# Patient Record
Sex: Female | Born: 2000 | Race: Black or African American | Marital: Single | Smoking: Never smoker
Health system: Northeastern US, Community
[De-identification: ages and names within clinical notes are randomized; demographics above are authoritative.]

---

## 2020-11-06 ENCOUNTER — Encounter (HOSPITAL_BASED_OUTPATIENT_CLINIC_OR_DEPARTMENT_OTHER): Payer: Self-pay | Admitting: Registered Nurse

## 2020-11-06 ENCOUNTER — Ambulatory Visit: Payer: No Typology Code available for payment source | Attending: Internal Medicine | Admitting: Registered Nurse

## 2020-11-06 DIAGNOSIS — Z113 Encounter for screening for infections with a predominantly sexual mode of transmission: Secondary | ICD-10-CM | POA: Diagnosis present

## 2020-11-06 DIAGNOSIS — Z0000 Encounter for general adult medical examination without abnormal findings: Secondary | ICD-10-CM | POA: Insufficient documentation

## 2020-11-06 NOTE — Progress Notes (Signed)
Judy Bell is a 19 year old female English speaking patient presents for a video visit in order to establish care with clinic, here from [place] short term since 9/21. No reported PMH or family history. Feeling well.     Updated meds, allergies, PMH, family history, social history, etc.    LMP:  10/20/20, regular, monthly periods, lasting 3-7 days of moderate bleeding, not on birth control currently, sexually active with female partners  Pap smear: never, will start age 19 for screening     Diet:meat and fish vegetables, fruits, whole grains, sweets, fast food, carbs, eats out mostly, no coffee intake reported   Exercise: no formal exercise     Height and weight:  5'2  62kg    covid- vaccinated in [place] [employer]   05/25/20, 07/23/20    Never smoker, no alcohol intake reported, no other drug use reported.     REVIEW OF SYSTEMS:  Constitutional: Patient denies any unexpected weight change, chronic fatigue, fevers or chills.  Respiratory: Patient denies any shortness of breath, wheezing, or new unexplained cough.  Cardiovascular: Patient denies any chest pains, chest palpitations, or chest tightness.  GI: Patient denies abdominal pain and has a good appetite, no nausea/vomiting, and normal bowel function without bloody stool.  GU: Denies vaginal discharge, pain, lesions, nocturia, dysuria, frequency, hesitancy, hematuria and retention.  MS: Patient denies any flank pain, back pain, myalgias, or recent skeletal fractures.  Neurologic: Patient denies any new recent headaches, weakness, dizziness or change in vision.   Psychiatry: Patient denies any new confusion, depression or anxiety.  Heme: no unexplained bruising or bleeding.  Skin: Patient denies and new rash, wounds, or other skin lesions.      Allergies:  Review of Patient's Allergies indicates:  No Known Allergies     No current outpatient medications on file prior to visit.  No current facility-administered medications on file prior to  visit.    Objective:  Physical exam:  Patient is calm and in no apparent distress. Able to speak in full sentences.     COGNITIVE STATUS AND LANGUAGE:  Alert   Oriented to person, place, time, and situation  Language: speech is fluent with normal comprehension; no dysarthria  Recent and remote memory are intact with appropriate fund of knowledge  Attention and concentration are appropriate    Assessment and plan:    (Z00.00) Physical exam  (primary encounter diagnosis)  Comment: 19 year old, feeling well, screening labs ordered   Plan: CBC WITH PLATELET, BASIC METABOLIC PANEL, LIPID        PANEL, HEMOGLOBIN A1C, THYROID SCREEN TSH         REFLEX FT4, VITAMIN D,25 HYDROXY        Will get in person, needs pap smear, will get vaccine record to bring to appointment     (Z11.3) Screening examination for STD (sexually transmitted disease)  Comment: asymptomatic, screening labs ordered   Plan: HEPATITIS C ANTIBODY, HIV ANTIGEN ANTIBODY 5TH         GEN, RPR, CHLAMYDIA GC NAAT        Will f/u with results     *I have discussed the frequency, duration, and side-effects of the prescribed medications.  The patient expresses knowledge and understanding of medications including possible side-effects and barriers. No barriers to adherence were identified.      *Medications and allergies are reviewed at this visit.  If medication changes were made during this visit, the patient was given a new medication sheet.  Dayton Martes, APRN

## 2020-12-02 ENCOUNTER — Ambulatory Visit (HOSPITAL_BASED_OUTPATIENT_CLINIC_OR_DEPARTMENT_OTHER): Payer: Self-pay | Admitting: Registered Nurse

## 2020-12-03 ENCOUNTER — Ambulatory Visit: Payer: No Typology Code available for payment source | Attending: Internal Medicine | Admitting: Internal Medicine

## 2020-12-03 ENCOUNTER — Encounter (HOSPITAL_BASED_OUTPATIENT_CLINIC_OR_DEPARTMENT_OTHER): Payer: Self-pay | Admitting: Internal Medicine

## 2020-12-03 ENCOUNTER — Other Ambulatory Visit: Payer: Self-pay

## 2020-12-03 VITALS — BP 117/69 | HR 84 | Temp 98.8°F | Ht 63.1 in | Wt 158.0 lb

## 2020-12-03 DIAGNOSIS — Z23 Encounter for immunization: Secondary | ICD-10-CM | POA: Diagnosis present

## 2020-12-03 DIAGNOSIS — N898 Other specified noninflammatory disorders of vagina: Secondary | ICD-10-CM | POA: Insufficient documentation

## 2020-12-03 DIAGNOSIS — Z0000 Encounter for general adult medical examination without abnormal findings: Secondary | ICD-10-CM

## 2020-12-03 DIAGNOSIS — Z124 Encounter for screening for malignant neoplasm of cervix: Secondary | ICD-10-CM | POA: Diagnosis present

## 2020-12-03 DIAGNOSIS — Z308 Encounter for other contraceptive management: Secondary | ICD-10-CM | POA: Diagnosis present

## 2020-12-03 DIAGNOSIS — Z113 Encounter for screening for infections with a predominantly sexual mode of transmission: Secondary | ICD-10-CM

## 2020-12-03 LAB — VAGINITIS PANEL PCR
BACTERIAL VAGINOSIS: NEGATIVE
CANDIDA GLABRATA: NEGATIVE
CANDIDA KRUSEI: NEGATIVE
CANDIDA SPECIES: NEGATIVE
TRICH VAGINALIS: NEGATIVE

## 2020-12-03 LAB — CBC WITH PLATELET
ABSOLUTE NRBC COUNT: 0 10*3/uL (ref 0.0–0.0)
HEMATOCRIT: 40.5 % (ref 34.1–44.9)
HEMOGLOBIN: 13.1 g/dL (ref 11.2–15.7)
MEAN CORP HGB CONC: 32.3 g/dL (ref 31.0–37.0)
MEAN CORPUSCULAR HGB: 28.9 pg (ref 26.0–34.0)
MEAN CORPUSCULAR VOL: 89.4 fl (ref 80.0–100.0)
MEAN PLATELET VOLUME: 12.6 fL — ABNORMAL HIGH (ref 8.7–12.5)
NRBC %: 0 % (ref 0.0–0.0)
PLATELET COUNT: 258 10*3/uL (ref 150–400)
RBC DISTRIBUTION WIDTH STD DEV: 45 fL (ref 35.1–46.3)
RED BLOOD CELL COUNT: 4.53 M/uL (ref 3.90–5.20)
WHITE BLOOD CELL COUNT: 10.9 10*3/uL (ref 4.0–11.0)

## 2020-12-03 LAB — HIV ANTIGEN ANTIBODY 5TH GEN: HIVAGAB QUALITATIVE: NONREACTIVE

## 2020-12-03 LAB — RPR: RPR QUALITATIVE: NONREACTIVE

## 2020-12-03 NOTE — Progress Notes (Signed)
Influenza and Pneumococcal Vaccine Procedure  December 03, 2020    1. Has the patient received the information for the influenza and pneumococcal vaccine? Yes    2. Does the patient have any of the following contraindications?  Allergy to eggs? No  Allergic reaction to previous influenza vaccines? No  Any other problems to previous influenza vaccines? No  Paralyzed by Guillain-Barre syndrome?  No  Current moderate or severe illness? No  Allergy to contact lens solution? No    3. The vaccines have been administered in the usual fashion and the patient/guardian was instructed to wait 20 minutes before leaving the building in the event of an allergic reaction:     Immunization information reviewed. Current VIS reviewed and given to patient/ guardian. Verbal assent obtained from patient/ guardian. Comfort measures for possible side effects reviewed.

## 2020-12-03 NOTE — Progress Notes (Addendum)
Adaja Wander is a 19 year old female, English speaking    Here for Pap Smear   She also wants to start on birthcontrol and get tested for STD/STI  Denies any STI/STD symptom    ROS:  CV: denies  Resp: denies any palpitations or chest pain  Neuro: denies any weakness or dizziness  Psych: feeling safe at home    Review of Patient's Allergies indicates:  No Known Allergies     Objectives  BP 117/69 (Site: RA, Position: Sitting, Cuff Size: Reg)    Pulse 84    Temp 98.8 F (37.1 C) (Oral)    Ht 5' 3.1" (1.603 m)    Wt 71.7 kg (158 lb)    SpO2 100%    BMI 27.90 kg/m      Constitutional: Well developed, Well nourished, No acute distress,   CV:NL S1/S2, No murmurs, rubs or gallops.   Resp:CTAB, No respiratory distress, No wheezing, crackles, rhonchi   Neurologic: Alert and oriented x4, moves all extremities with full power and strength, sensation intact bilaterally, cranial nerves intact, speaking in clear fluent sentences. Normal gait   Skin: Clear with no lesions  Psych: normal mood / behavior, pleasant    PELVIC:  External Genitalia:normal architecture  Urethral Meatus:normal size and location, without lesions or prolapse  Urethra:without masses, tenderness or scarring  Bladder:without fullness, masses or tenderness  Vagina:Intact with no lesions  Vaginal Discharge:moderate thick white discharge  Pelvic supports:normal  Cervix:no cervical motion tenderness  Uterus:non-tender    Plan  (N89.8) Vaginal discharge  (primary encounter diagnosis)  Comment  Plan: VAGINITIS PANEL PCR  Provider will cal or mail the results to pt    (Z12.4) Cervical cancer screening  Comment  Plan: CYTOPATH, C/V, THIN LAYER  Provider will call or mail results to pt    (Z30.8) Encounter for other contraceptive management  Comment  Plan: REFERRAL TO GYNECOLOGY ([facility])  To follow up with her PCP    (Z23) Need for prophylactic vaccination and inoculation against influenza  Comment  Plan: IMMUNIZATION ADMIN SINGLE, IIV4 VACC  PRESERV   FREE AGE 67 MONTHS AND OLDER, 0.5ML, IM          (Z11.3) Screening examination for STD (sexually transmitted disease)  Comment  Plan: RPR, HIV ANTIGEN ANTIBODY 5TH GEN, HEPATITIS C   ANTIBODY  Order placed by PCP. Pt will F/U with PCP for results    (Z00.00) Physical exam  Comment  Plan: VITAMIN D,25 HYDROXY, THYROID SCREEN TSH REFLEX   FT4, HEMOGLOBIN A1C, LIPID PANEL, BASIC    METABOLIC PANEL, CBC WITH PLATELET   Order placed by PCP. Pt will  F/U with PCP for results     Mat Carne, RN, 12/03/2020

## 2020-12-03 NOTE — Progress Notes (Signed)
Judy Bell is a 19 year old female, English speaking  Patient of Carlena Sax APRN    Pt is here for Pap smear  Requesting for birth control  And std testing   Denies any vaginal discharge or itchiness   Denies any urinary symptoms    ROS:  CV: denies any chest pain  RESP: denies any shortness of breath  Neuro: denies any weakness or dizziness  Psych: safe at home    Allergies:  Review of Patient's Allergies indicates:  No Known Allergies    Objective  BP 117/69 (Site: RA, Position: Sitting, Cuff Size: Reg)    Pulse 84    Temp 98.8 F (37.1 C) (Oral)    Ht 5' 3.1" (1.603 m)    Wt 71.7 kg (158 lb)    SpO2 100%    BMI 27.90 kg/m     Constitutional: Well developed, Well nourished, No acute distress,   CV:NL S1/S2, No murmurs, rubs or gallops.   Resp:CTAB, No respiratory distress, No wheezing, crackles, rhonchi   Neurologic: Alert and oriented x4, moves all extremities with full power and strength, sensation intact bilaterally, cranial nerves intact, speaking in clear fluent sentences. Normal gait   Psych: normal mood / behavior, pleasant    PELVIC:  External Genitalia: normal architecture  Urethral Meatus: normal size and location, without lesions or prolapse  Urethra: without masses, tenderness or scarring  Bladder: without fullness, masses or tenderness  Vagina: well rugated and no lesions  Vaginal Discharge: moderate thick white discharge  Pelvic supports: normal  Cervix: no cervical motion tenderness  Uterus: non-tender  Adnexa: no masses, nodularity, tenderness    PLAN  (N89.8) Vaginal discharge  (primary encounter diagnosis)  Comment:   Plan: VAGINITIS PANEL PCR        Will call or mail results    (Z12.4) Cervical cancer screening  Comment:   Plan: CYTOPATH, C/V, THIN LAYER        Will call or mail results    (Z30.8) Encounter for other contraceptive management  Comment:   Plan: REFERRAL TO GYNECOLOGY ([facility])        To follow up with GYN    (Z23) Need for prophylactic vaccination and inoculation  against influenza  Comment:   Plan: IMMUNIZATION ADMIN SINGLE, IIV4 VACC PRESERV         FREE AGE 74 MONTHS AND OLDER, 0.5ML, IM    (Z11.3) Screening examination for STD (sexually transmitted disease)  Comment:   Plan: RPR, HIV ANTIGEN ANTIBODY 5TH GEN, HEPATITIS C         ANTIBODY        Ordered by PCP         To follow up with PCP for results    (Z00.00) Physical exam  Comment:   Plan: VITAMIN D,25 HYDROXY, THYROID SCREEN TSH REFLEX        FT4, HEMOGLOBIN A1C, LIPID PANEL, BASIC         METABOLIC PANEL, CBC WITH PLATELET        Ordered by PCP        To follow up with PCP for results     I have discussed the frequency, duration, and side-effects of  the prescribed medications.  The patient expresses knowledge and understanding of medications including possible side-effects and barriers. No barriers to adherence were identified.     Medications and allergies are reviewed at this visit.  If medication changes were made during this visit, the patient was given a new  medication sheet.    Judy Mcloughlin Yaakov Guthrie, APRN, 12/03/2020

## 2020-12-04 LAB — HEMOGLOBIN A1C
ESTIMATED AVERAGE GLUCOSE: 111 mg/dL (ref 74–160)
HEMOGLOBIN A1C: 5.5 % (ref 4.0–5.6)

## 2020-12-07 ENCOUNTER — Encounter (HOSPITAL_BASED_OUTPATIENT_CLINIC_OR_DEPARTMENT_OTHER): Payer: Self-pay | Admitting: Internal Medicine

## 2020-12-07 LAB — THYROID SCREEN TSH REFLEX FT4: THYROID SCREEN TSH REFLEX FT4: 1.44 u[IU]/mL (ref 0.358–3.740)

## 2020-12-07 LAB — LIPID PANEL
Cholesterol: 141 mg/dL (ref 0–239)
HIGH DENSITY LIPOPROTEIN: 72 mg/dL (ref 40–?)
LOW DENSITY LIPOPROTEIN DIRECT: 48 mg/dL (ref 0–189)
TRIGLYCERIDES: 93 mg/dL (ref 0–150)

## 2020-12-07 LAB — BASIC METABOLIC PANEL
ANION GAP: 5 mmol/L (ref 5–15)
BUN (UREA NITROGEN): 12 mg/dL (ref 7–18)
CALCIUM: 9.2 mg/dL (ref 8.5–10.1)
CARBON DIOXIDE: 27 mmol/L (ref 21–32)
CHLORIDE: 106 mmol/L (ref 98–107)
CREATININE: 0.8 mg/dL (ref 0.4–1.2)
ESTIMATED GLOMERULAR FILT RATE: >60 mL/min (ref >60)
Glucose Random: 65 mg/dL — ABNORMAL LOW (ref 74–160)
POTASSIUM: 3.9 mmol/L (ref 3.5–5.1)
SODIUM: 138 mmol/L (ref 136–145)

## 2020-12-07 LAB — VITAMIN D,25 HYDROXY: VITAMIN D,25 HYDROXY: 28 ng/mL — ABNORMAL LOW (ref 30.0–100.0)

## 2020-12-07 LAB — CYTOPATH, C/V, THIN LAYER

## 2020-12-07 LAB — HEPATITIS C ANTIBODY: HEPATITIS C ANTIBODY: NONREACTIVE

## 2020-12-08 ENCOUNTER — Encounter (HOSPITAL_BASED_OUTPATIENT_CLINIC_OR_DEPARTMENT_OTHER): Payer: Self-pay | Admitting: Registered Nurse

## 2020-12-23 ENCOUNTER — Telehealth (HOSPITAL_BASED_OUTPATIENT_CLINIC_OR_DEPARTMENT_OTHER): Payer: Self-pay

## 2020-12-23 ENCOUNTER — Ambulatory Visit (HOSPITAL_BASED_OUTPATIENT_CLINIC_OR_DEPARTMENT_OTHER): Payer: No Typology Code available for payment source

## 2020-12-23 NOTE — Telephone Encounter (Signed)
Lmom for patient to call back. Please schedule an appointment with the family planner. Per referral patient not sure if she wants IUD or Nexplanon. Attach referral

## 2020-12-24 ENCOUNTER — Telehealth (HOSPITAL_BASED_OUTPATIENT_CLINIC_OR_DEPARTMENT_OTHER): Payer: Self-pay | Admitting: Registered Nurse

## 2020-12-24 NOTE — Telephone Encounter (Signed)
Second attempt:    Hilario Quarry made the 2nd attempt on 12/24/20 and lmom for patient to call back.Clarisa Kindred  to [employer] Desk Pool    MB     12/24/20 1:48 PM  Lmom for patient to call back. Please schedule an appointment with the family planner. Per referral patient not sure if she wants IUD or Nexplanon. Attach referral

## 2020-12-28 ENCOUNTER — Encounter (HOSPITAL_BASED_OUTPATIENT_CLINIC_OR_DEPARTMENT_OTHER): Payer: Self-pay

## 2020-12-28 DIAGNOSIS — Z30011 Encounter for initial prescription of contraceptive pills: Secondary | ICD-10-CM

## 2020-12-28 NOTE — Progress Notes (Signed)
3rd attempt, mychart message sent

## 2020-12-28 NOTE — Progress Notes (Signed)
2nd attempt, lmom asking for a call back

## 2020-12-29 ENCOUNTER — Ambulatory Visit: Payer: No Typology Code available for payment source | Attending: Internal Medicine

## 2020-12-29 ENCOUNTER — Other Ambulatory Visit: Payer: Self-pay

## 2020-12-29 DIAGNOSIS — Z3009 Encounter for other general counseling and advice on contraception: Secondary | ICD-10-CM

## 2020-12-29 NOTE — Progress Notes (Signed)
- This patient was identified as meeting criteria for telephone encounter rather than an in person visit due to public health concerns around COVID-19.  - Patient identity was verbally confirmed by the patient with two identifiers (name, date of birth, and/or address).  - Patient was located home during the visit and they were encouraged to be in a private location due to personal health information being discussed.  - Health counselor was located outside the office at a secure location during the visit.  - Pt was informed in how to access face-to-face care in the event of an emergency.      Reason for Visit: Contraceptive counseling    Confidential billing needed(ZZFamPlan): no  Safe or Confidential Phone #: [phone]  Preferred Name: Lenox Ahr  Pronouns: she/her/hers    Social:  Home Life: with cousin  Work: yes  School: no    Medications and allergies reviewed.    Social History    Tobacco Use      Smoking status: Never Smoker      Smokeless tobacco: Never Used      GYN:  LMP:12/06/20  OB History    No obstetric history on file.  Pregnancy Desired:no  Last GYN Exam (date/result): 12/03/20  Hx of Abnormal PAP: no  Current GYN Symptoms: no    Sexual History:    Sexual activity: Yes   Partners: Male     Current Partner(s): yes  Current Sexual Activity: vaginal  Satisfaction with Current Method: not using bcm  Missed Pills, Patch, Ring Change, or Depo:n/a  Side Effects/Problems: n/a  ACHES: n/a  Using Method Correctly: n/a  Contraceptive Use History: ocp  Contraindications Screening (as applicable):   Contraindications:  Has patient:  - Given birth in the past 6 weeks - no  - Ever been hospitalized, other than for childbirth - no  - Ever had surgery - no    Does patient report having or having being told that they have:   - Breast problems or breast lumps - no  - Bleeding between periods or unexplained vaginal bleeding - no  - High blood pressure - no  - Migraine headaches - no   History of migraine with aura:no  -  Heart problems - no  - Blood clot or stroke - no  - Diabetes - no  - Problems with liver (including hepatitis) - no  - Cancer - no  - Lupus - no    Date of Last Sexual Activity: 12/10/20  Protection Used: yes  EC Need:no  Condom Use:  sometimes  Condom Breakage: no  Condom Instruction Given: yes    Sexual Concerns or Questions: no    History of STIs:    Recent Testing: yes  Component      Latest Ref Rng & Units 12/03/2020   HIVAGAB QUALITATIVE      NONREACTIVE NON-REACTIVE   HIV-1 ANTIBODY 5TH GEN      0.00 - 0.99 INDEX TNP   HIV-2 ANTIBODY 5TH GEN      0.00 - 0.99 INDEX TNP   HIV-1 ANTIGEN 5TH GEN      0.00 - 0.99 INDEX TNP   HIV-1 ANTIBODY GEENIUS       TNP   HIV-2 ANTIBODY GEENIUS       TNP   HIV RESULT INTERPRETATION       TNP   RPR QUALITATIVE      NONREACTIVE NON-REACTIVE   HEPATITIS C ANTIBODY      NONREACTIVE NON-REACTIVE  Testing Offered: yes  Testing Accepted: no   -Disc: Transmission (Blood, Semen, Vaginal Fluid and Breastmilk), Risky Behaviors, Non-Risks/Myths,  Seroconversion Period , Result Waiting Time, Possible Results, Feelings Surrounding Possible Results, Plans for Informing of Results, Support Structures and Hep C Risk.    Risk Reduction Planning Completed     DV Assessment:  Hx of DV/IP Abuse: no  Hx of Sexual Assault: no  Sexual Coercion: no  Birth Control Coercion:no  Received Money, Food, Shelter, or Drugs for Sex: no    Plan:   Next PE Due: 2022  Testing Done:no  Prescriptions Done: no  R  Follow Up Plan:     JERRIYAH LOUIS is a 20 year old female  Contacted today to discuss contraceptive method.  Pt stated not using bcm at the moment.  Discussed with pt regarding contraceptive method based on possible risks, benefits, effectiveness, warning signs and side effects and pt requested a return call on 12/31/20 to discuss her choice.  Pt verbalized understanding instructions and plan care.    After a discussion on all available contraceptive methods and a screening of contraindications per  the U.S. Medical Eligibility Criteria, patient has chosen no bcm. Patient was counseled on risks, benefits, appropriate method use per the U.S. Selected Practice Recommendations, method effectiveness, follow up plan, protection from STIs, warning signs and what to do if they experience a warning sign, and confirms understanding by the teach-back method. Patient has received the method fact sheet and will contact the health center with any questions or concerns.       Alexandria Lodge

## 2020-12-29 NOTE — Progress Notes (Signed)
Pt called back, scheduled with FP

## 2020-12-31 ENCOUNTER — Ambulatory Visit: Payer: No Typology Code available for payment source

## 2020-12-31 DIAGNOSIS — Z3009 Encounter for other general counseling and advice on contraception: Secondary | ICD-10-CM

## 2020-12-31 MED ORDER — LEVONORGESTREL-ETHINYL ESTRAD 0.1-20 MG-MCG PO TABS
1.00 | ORAL_TABLET | Freq: Every day | ORAL | 11 refills | Status: AC
Start: 2020-12-31 — End: 2021-12-31

## 2020-12-31 MED FILL — AVIANE: 28 days supply | Qty: 28 | Fill #0

## 2020-12-31 NOTE — Telephone Encounter (Signed)
-----   Message from Alexandria Lodge sent at 12/31/2020  1:56 PM EST -----  Hello,    Could you please send a rx for ocp(coc) to the [facility] out/pt pharmacy for this pt?  [facility] counseling done, please see notes.    Thank you!    Alexandria Lodge

## 2020-12-31 NOTE — Progress Notes (Signed)
- This patient was identified as meeting criteria for telephone encounter rather than an in person visit due to public health concerns around COVID-19.  - Patient identity was verbally confirmed by the patient with two identifiers (name, date of birth, and/or address).  - Patient was located home during the visit and they were encouraged to be in a private location due to personal health information being discussed.  - Health counselor was located outside the office at a secure location during the visit.  - Pt was informed in how to access face-to-face care in the event of an emergency.      Reason for Visit: Contraceptive counseling    Confidential billing needed(ZZFamPlan): no  Safe or Confidential Phone #: [phone]  Preferred Name: Judy Bell  Pronouns: she/her/hers    Social:  Home Life: With cousin  Work: yes  School: no    Medications and allergies reviewed.    Social History    Tobacco Use      Smoking status: Never Smoker      Smokeless tobacco: Never Used      GYN:  LMP:12/06/20  OB History    No obstetric history on file.  Pregnancy Desired:no  Last GYN Exam (date/result): 12/03/20  Hx of Abnormal PAP: no  Current GYN Symptoms: no    Sexual History:    Sexual activity: Yes   Partners: Male     Current Partner(s): yes  Current Sexual Activity: vaginal  Satisfaction with Current Method: not using bcm  Missed Pills, Patch, Ring Change, or Depo:n/a  Side Effects/Problems: n/a  ACHES: n/a  Using Method Correctly:n/a  Contraceptive Use History: condoms  Contraindications Screening (as applicable):   Contraindications:  Has patient:  - Given birth in the past 6 weeks - no  - Ever been hospitalized, other than for childbirth - no  - Ever had surgery - no    Does patient report having or having being told that they have:   - Breast problems or breast lumps - no  - Bleeding between periods or unexplained vaginal bleeding - no  - High blood pressure -no  - Migraine headaches - no   History of migraine with aura:  no  - Heart problems - no  - Blood clot or stroke - no  - Diabetes - no  - Problems with liver (including hepatitis) - no  - Cancer - no  - Lupus - no    Date of Last Sexual Activity:12/10/20  Protection Used: yes  EC Need: no  Condom Use:  sometimes  Condom Breakage:no  Condom Instruction Given: yes    Sexual Concerns or Questions: no    History of STIs:    Recent Testing:  Yes  Component      Latest Ref Rng & Units 12/03/2020   HIVAGAB QUALITATIVE      NONREACTIVE NON-REACTIVE   HIV-1 ANTIBODY 5TH GEN      0.00 - 0.99 INDEX TNP   HIV-2 ANTIBODY 5TH GEN      0.00 - 0.99 INDEX TNP   HIV-1 ANTIGEN 5TH GEN      0.00 - 0.99 INDEX TNP   HIV-1 ANTIBODY GEENIUS       TNP   HIV-2 ANTIBODY GEENIUS       TNP   HIV RESULT INTERPRETATION       TNP   RPR QUALITATIVE      NONREACTIVE NON-REACTIVE   HEPATITIS C ANTIBODY      NONREACTIVE NON-REACTIVE  Testing Offered: yes  Testing Accepted: no  -Disc: Transmission (Blood, Semen, Vaginal Fluid and Breastmilk), Risky Behaviors, Non-Risks/Myths,  Seroconversion Period , Result Waiting Time, Possible Results, Feelings Surrounding Possible Results, Plans for Informing of Results, Support Structures and Hep C Risk.    Risk Reduction Planning Completed     DV Assessment:  Hx of DV/IP Abuse: no  Hx of Sexual Assault: no  Sexual Coercion: no  Birth Control Coercion:no  Received Money, Food, Shelter, or Drugs for Sex: no    Plan:   Next PE Due: 2022  Testing Done: no  Prescriptions Done: Msg sent to the [facility] provider's pool with ocp request.    Follow Up Plan:     Judy Bell is a 20 year old female  Contacted today to discuss contraceptive method  Pt discussed with [facility] on 12/29/20 regarding contraceptive methos and asked to return call today to decide.  Discussed with pt regarding contraceptive method based on possible risks, benefits, effectiveness, warning signs and side effects and pt chose ocp.  Pt verbalized understanding instructions and plan care.    After a discussion on all  available contraceptive methods and a screening of contraindications per the U.S. Medical Eligibility Criteria, patient has chosen OCP. Patient was counseled on risks, benefits, appropriate method use per the U.S. Selected Practice Recommendations, method effectiveness, follow up plan, protection from STIs, warning signs and what to do if they experience a warning sign, and confirms understanding by the teach-back method. Patient has received the method fact sheet and will contact the health center with any questions or concerns.       Alexandria Lodge

## 2021-01-06 ENCOUNTER — Other Ambulatory Visit: Payer: Self-pay

## 2021-01-06 ENCOUNTER — Ambulatory Visit: Payer: No Typology Code available for payment source | Attending: Internal Medicine

## 2021-01-06 DIAGNOSIS — Z23 Encounter for immunization: Secondary | ICD-10-CM

## 2021-01-22 ENCOUNTER — Encounter (HOSPITAL_BASED_OUTPATIENT_CLINIC_OR_DEPARTMENT_OTHER): Payer: Self-pay | Admitting: Registered Nurse

## 2021-01-31 ENCOUNTER — Encounter (HOSPITAL_BASED_OUTPATIENT_CLINIC_OR_DEPARTMENT_OTHER): Payer: Self-pay | Admitting: Registered Nurse

## 2021-01-31 DIAGNOSIS — Z30011 Encounter for initial prescription of contraceptive pills: Secondary | ICD-10-CM

## 2021-02-02 MED FILL — AVIANE: 28 days supply | Qty: 28 | Fill #1

## 2021-02-03 ENCOUNTER — Encounter (HOSPITAL_BASED_OUTPATIENT_CLINIC_OR_DEPARTMENT_OTHER): Payer: Self-pay | Admitting: Registered Nurse

## 2021-02-03 DIAGNOSIS — Z30011 Encounter for initial prescription of contraceptive pills: Secondary | ICD-10-CM

## 2021-02-15 ENCOUNTER — Encounter (HOSPITAL_BASED_OUTPATIENT_CLINIC_OR_DEPARTMENT_OTHER): Payer: Self-pay | Admitting: Internal Medicine

## 2021-03-03 ENCOUNTER — Encounter (HOSPITAL_BASED_OUTPATIENT_CLINIC_OR_DEPARTMENT_OTHER): Payer: Self-pay | Admitting: Registered Nurse

## 2021-03-03 DIAGNOSIS — Z30011 Encounter for initial prescription of contraceptive pills: Secondary | ICD-10-CM

## 2021-03-04 ENCOUNTER — Encounter (HOSPITAL_BASED_OUTPATIENT_CLINIC_OR_DEPARTMENT_OTHER): Payer: Self-pay

## 2021-03-04 NOTE — Telephone Encounter (Signed)
[facility] confirmed that aviane has active refills remaining. No refill is required at this time.

## 2021-03-09 MED FILL — AVIANE: 28 days supply | Qty: 28 | Fill #2

## 2021-03-17 ENCOUNTER — Encounter (HOSPITAL_BASED_OUTPATIENT_CLINIC_OR_DEPARTMENT_OTHER): Payer: Self-pay | Admitting: Registered Nurse

## 2021-04-06 MED FILL — AVIANE: 28 days supply | Qty: 28 | Fill #3 | Status: CP

## 2021-04-29 MED FILL — AVIANE: 28 days supply | Qty: 28 | Fill #4

## 2021-06-02 MED FILL — AVIANE: 28 days supply | Qty: 28 | Fill #5

## 2023-09-23 IMAGING — MR RM DE JOELHO DIREITO
4 of 8 series · 15 of 40 positions shown · non-contrast
Comparison: none

[Series 101: survey · axial · 10.0mm · 1.16mm/px · 1 of 3 slices shown]
[im 1/3]
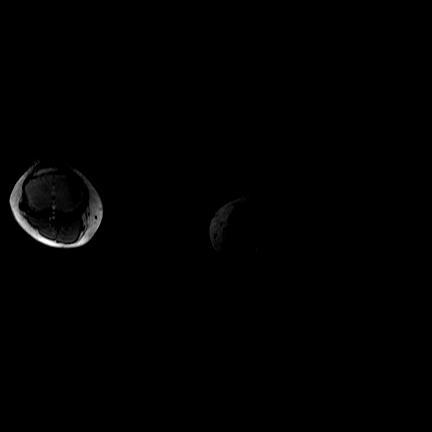

[Series 201: survey (id) · axial · 10.0mm · 1.17mm/px · z∈[-0,+160]mm · 2 of 9 slices shown]
[im 1/9]
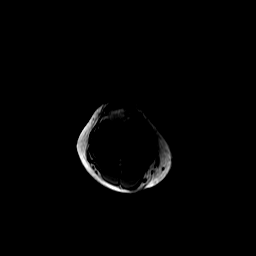
[im 9/9]
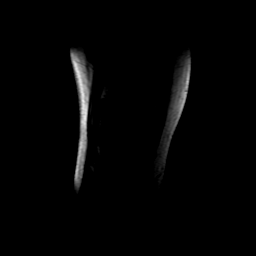

[Series 401: T2 · sagittal · 3.5mm · 0.23mm/px · 5 of 25 slices shown]
[im 1/25]
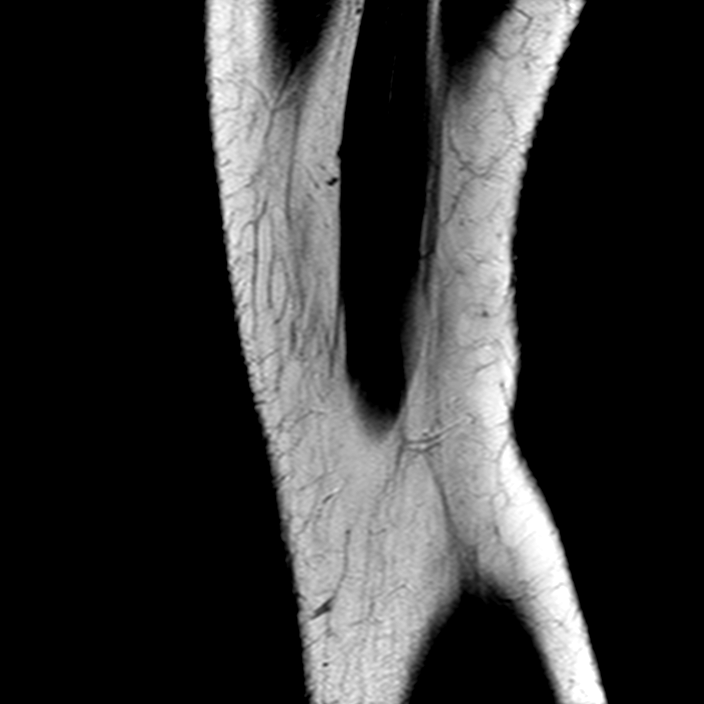
[im 5/25]
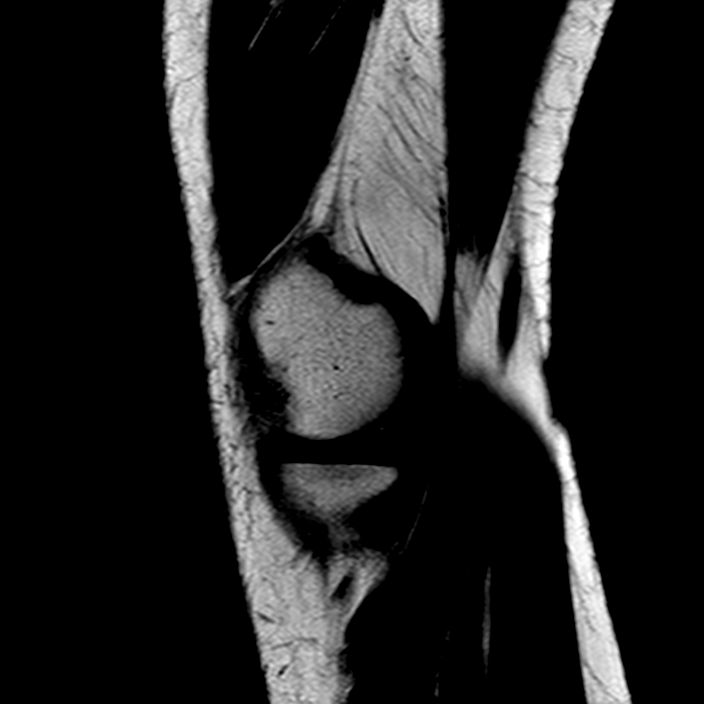
[im 9/25]
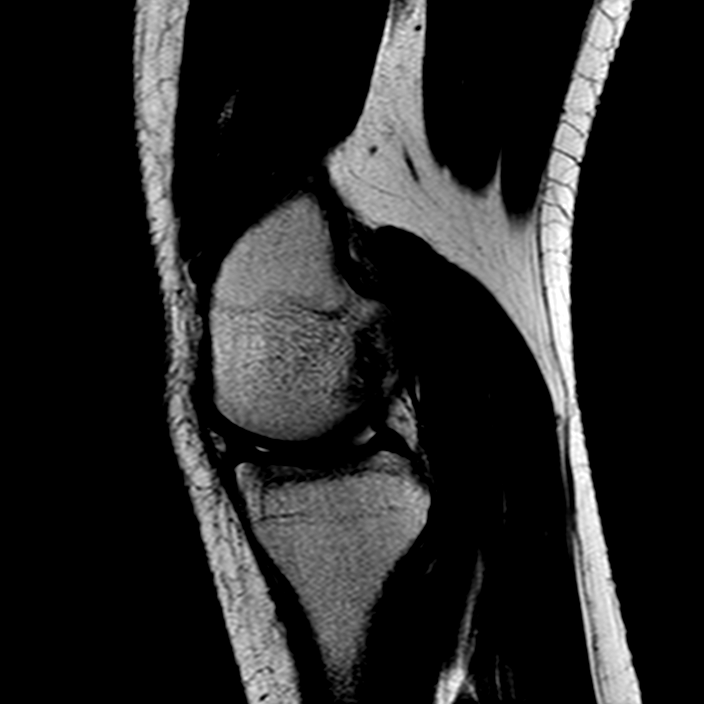
[im 13/25]
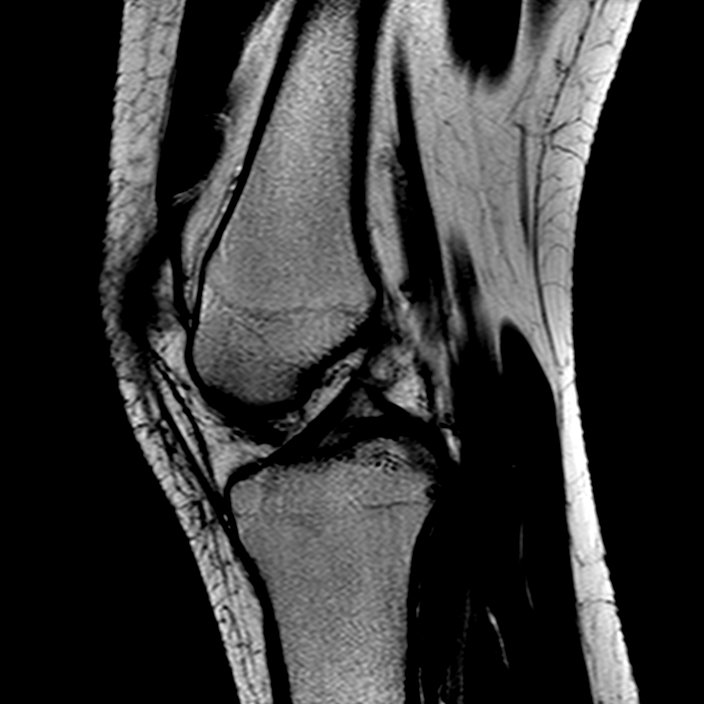
[im 21/25]
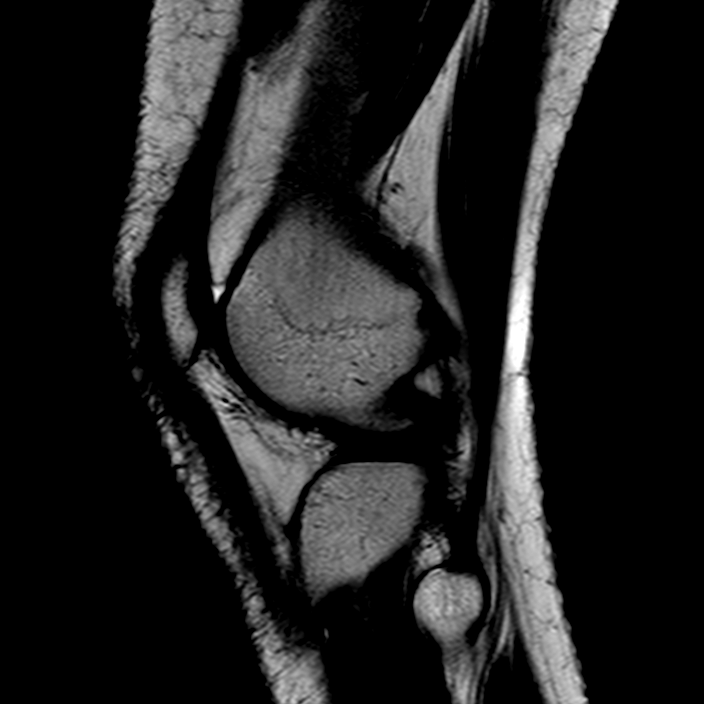

[Series 801: T1 · sagittal · 3.5mm · 0.29mm/px · 7 of 25 slices shown]
[im 1/25]
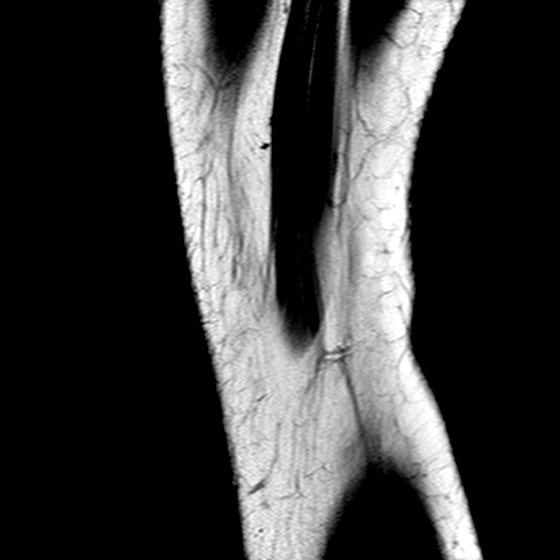
[im 5/25]
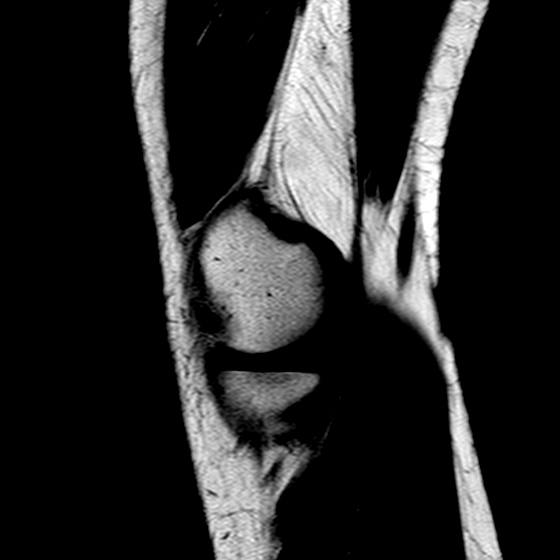
[im 9/25]
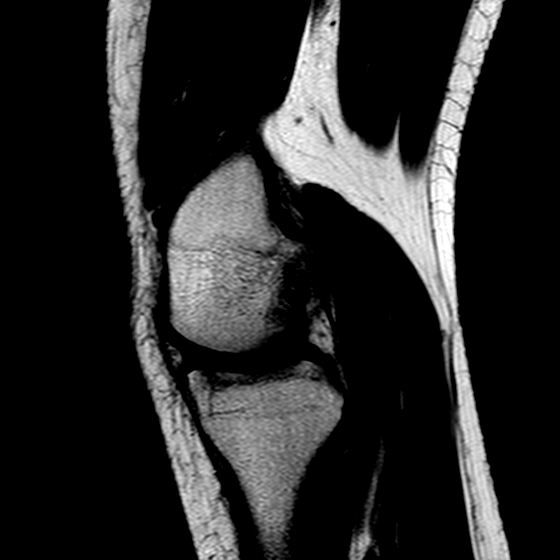
[im 13/25]
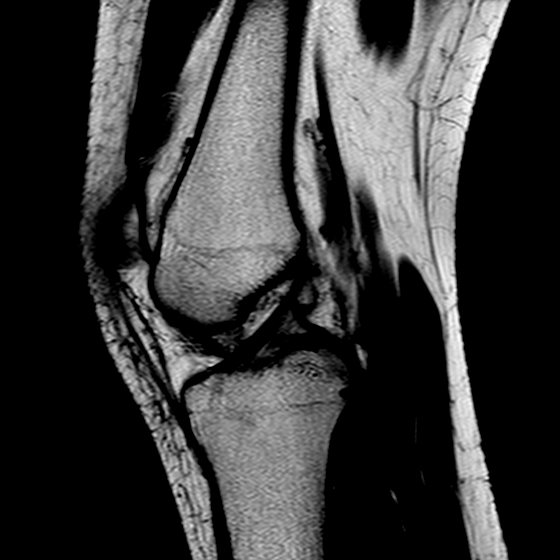
[im 17/25]
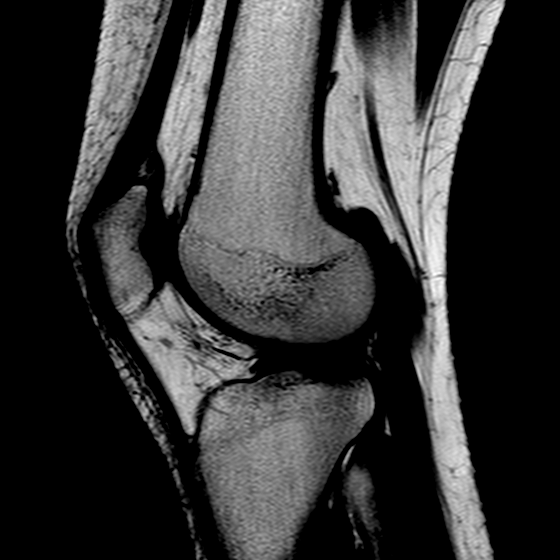
[im 21/25]
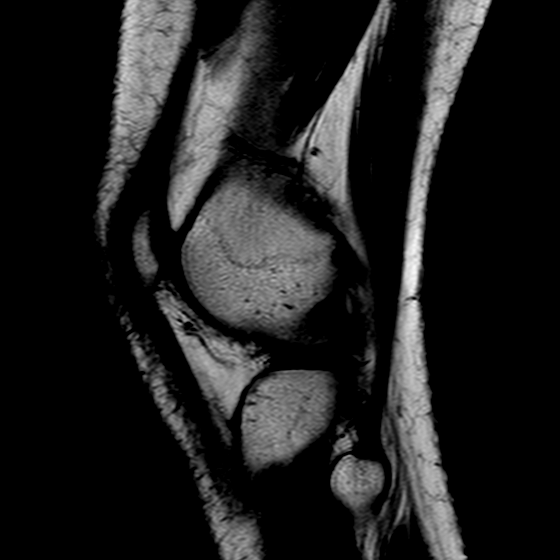
[im 25/25]
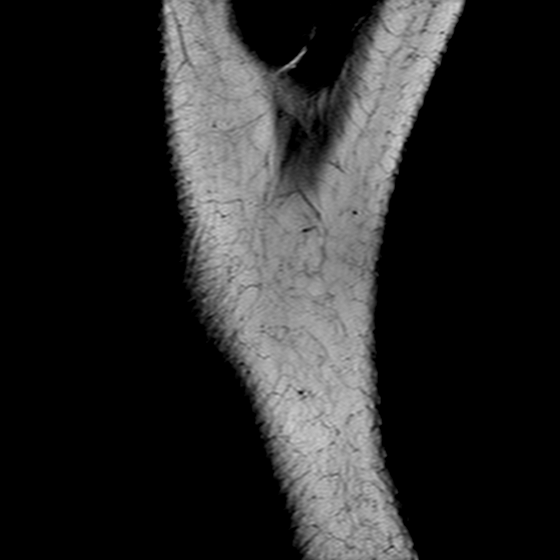

[15 of 40 positions shown; findings below may reference images not displayed]

RESSONÂNCIA MAGNÉTICA DO JOELHO DIREITO

TÉCNICA:

Exame realizado em equipamento de ressonância magnética com sequências, ponderações e planos específicos para o segmento de interesse, sem a administração endovenosa do meio de contraste.

RESULTADO:

Meniscos com forma, contornos e sinal preservados, sem sinais de lesão.
Áreas de condropatia profunda das porções anteriores do côndilo femoral e do planalto tibial medial, sem edema ósseo subcondral associado.
Patela com altura habitual e com leve inclinação lateral.
Presença de plica supra-patelar e médio-patelar com espessura normal.
Sinais de displasia patelo-femoral, com hipoplasia da faceta medial da patela associada a tróclea rasa.
Leve edema da gordura suprapatelar e infrapatelar lateral com aspecto de processo inflamatório relacionada a síndrome do impacto pelo mau alinhamento patelar.
Leve derrame articular.
Ligamentos cruzados e colaterais com continuidade, espessura e sinal conservados.
Demais superfícies condrais regulares, sem fissuras ou erosões evidentes.
Estruturas ósseas com morfologia e sinal medular conservados.
Tendões quadricipital, patelar, bíceps femoral distal, poplíteo, trato iliotibial e tendões da pata de ganso sem particularidades.
Fossa poplítea sem formações císticas.
Estruturas que compõem o canto posterolateral íntegras.
Não há evidências de massas ou de coleções nos planos musculo-adiposos peri-articulares examinados.
Feixes vasculo-nervosos sem alterações.
CONCLUSÃO:

Áreas de condropatia profunda das porções anteriores do côndilo femoral e do planalto tibial medial, sem edema ósseo subcondral associado.
Patela com leve inclinação lateral.
Presença de plica supra-patelar e médio-patelar com espessura normal.
Sinais de displasia patelo-femoral.
Leve edema da gordura suprapatelar e infrapatelar lateral com aspecto de processo inflamatório relacionada a síndrome do impacto pelo mau alinhamento patelar.
Leve derrame articular.
